# Patient Record
Sex: Female | Born: 1935 | Race: White | Hispanic: No | Smoking: Never smoker
Health system: Southern US, Community
[De-identification: ages and names within clinical notes are randomized; demographics above are authoritative.]

## PROBLEM LIST (undated history)

## (undated) DIAGNOSIS — M1990 Unspecified osteoarthritis, unspecified site: Secondary | ICD-10-CM

## (undated) DIAGNOSIS — K219 Gastro-esophageal reflux disease without esophagitis: Secondary | ICD-10-CM

## (undated) DIAGNOSIS — I2699 Other pulmonary embolism without acute cor pulmonale: Secondary | ICD-10-CM

## (undated) DIAGNOSIS — I10 Essential (primary) hypertension: Secondary | ICD-10-CM

## (undated) DIAGNOSIS — E079 Disorder of thyroid, unspecified: Secondary | ICD-10-CM

## (undated) HISTORY — PX: CHOLECYSTECTOMY: SHX55

## (undated) HISTORY — PX: ABDOMINAL HYSTERECTOMY: SHX81

---

## 2005-10-18 ENCOUNTER — Ambulatory Visit: Payer: Self-pay | Admitting: Family Medicine

## 2006-10-21 ENCOUNTER — Ambulatory Visit: Payer: Self-pay | Admitting: Family Medicine

## 2006-11-12 ENCOUNTER — Ambulatory Visit: Payer: Self-pay | Admitting: Podiatry

## 2007-01-01 ENCOUNTER — Ambulatory Visit: Payer: Self-pay | Admitting: Unknown Physician Specialty

## 2008-04-15 ENCOUNTER — Ambulatory Visit: Payer: Self-pay | Admitting: Family Medicine

## 2009-12-31 ENCOUNTER — Ambulatory Visit: Payer: Self-pay | Admitting: Cardiovascular Disease

## 2009-12-31 ENCOUNTER — Inpatient Hospital Stay: Payer: Self-pay | Admitting: Internal Medicine

## 2010-01-14 ENCOUNTER — Ambulatory Visit: Payer: Self-pay | Admitting: Oncology

## 2010-01-17 ENCOUNTER — Ambulatory Visit: Payer: Self-pay | Admitting: Oncology

## 2010-02-14 ENCOUNTER — Ambulatory Visit: Payer: Self-pay | Admitting: Oncology

## 2010-02-16 ENCOUNTER — Ambulatory Visit: Payer: Self-pay | Admitting: Internal Medicine

## 2010-04-28 ENCOUNTER — Ambulatory Visit: Payer: Self-pay | Admitting: Internal Medicine

## 2010-04-28 ENCOUNTER — Observation Stay: Payer: Self-pay | Admitting: Internal Medicine

## 2010-06-07 ENCOUNTER — Ambulatory Visit: Payer: Self-pay | Admitting: Internal Medicine

## 2010-06-08 ENCOUNTER — Ambulatory Visit: Payer: Self-pay | Admitting: Family Medicine

## 2010-07-14 ENCOUNTER — Ambulatory Visit: Payer: Self-pay | Admitting: Oncology

## 2010-07-16 ENCOUNTER — Ambulatory Visit: Payer: Self-pay | Admitting: Oncology

## 2010-09-22 ENCOUNTER — Ambulatory Visit: Payer: Self-pay | Admitting: Internal Medicine

## 2010-11-08 ENCOUNTER — Ambulatory Visit: Payer: Self-pay | Admitting: Ophthalmology

## 2011-02-28 ENCOUNTER — Ambulatory Visit: Payer: Self-pay | Admitting: Internal Medicine

## 2012-01-31 ENCOUNTER — Ambulatory Visit: Payer: Self-pay | Admitting: Nurse Practitioner

## 2012-06-09 IMAGING — CT CT CHEST W/ CM
1 series · 15 of 34 positions shown, 19 images · IV contrast (APPLIED)
Comparison: none

REASON FOR EXAM: SOB  pulmonary embolus fu
COMMENTS:

[Series 4: soft tissue · axial · 0.68mm/px · z∈[+244,+448]mm · 15 of 80 slices shown, 19 images]
[im 6/80  mediastinal]
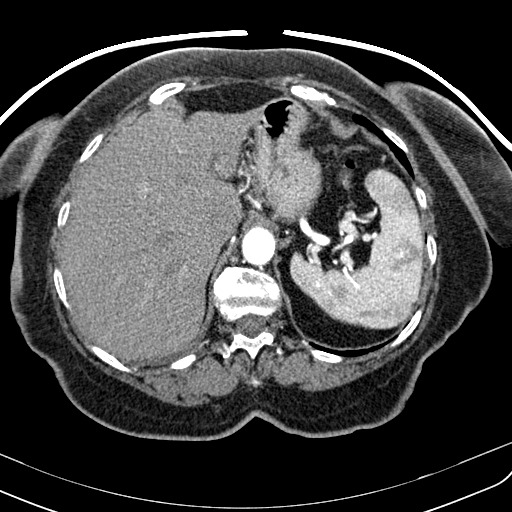
[im 6/80  lung]
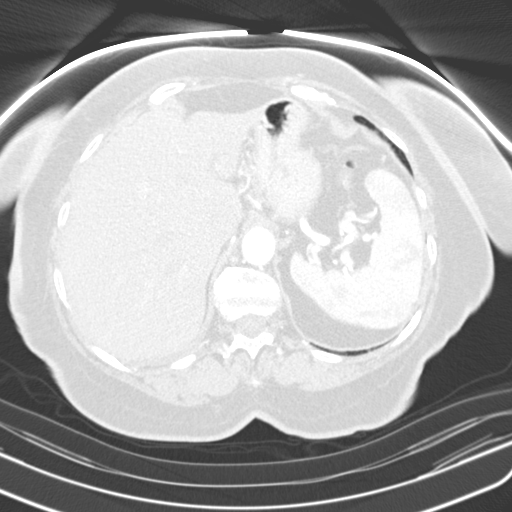
[im 12/80  lung]
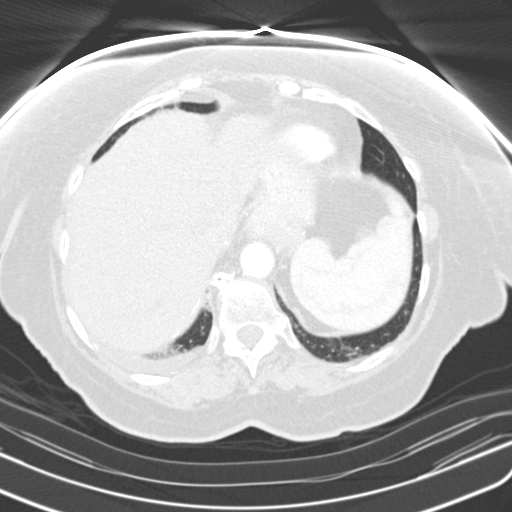
[im 16/80  lung]
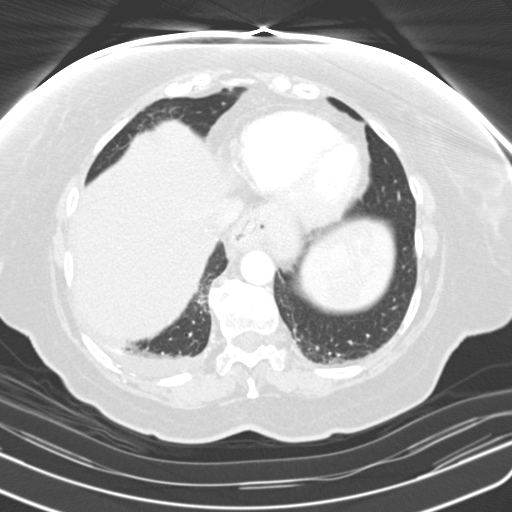
[im 21/80  lung]
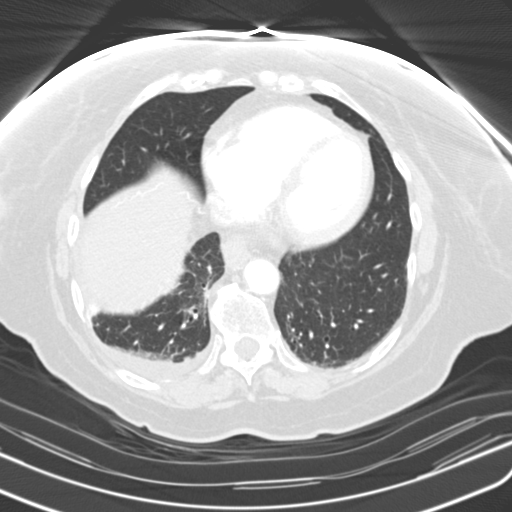
[im 27/80  mediastinal]
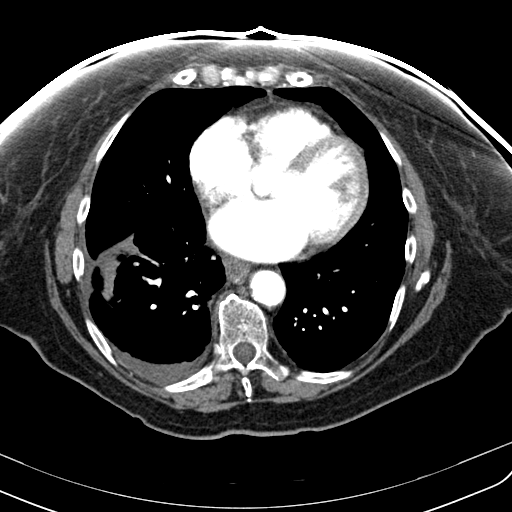
[im 27/80  lung]
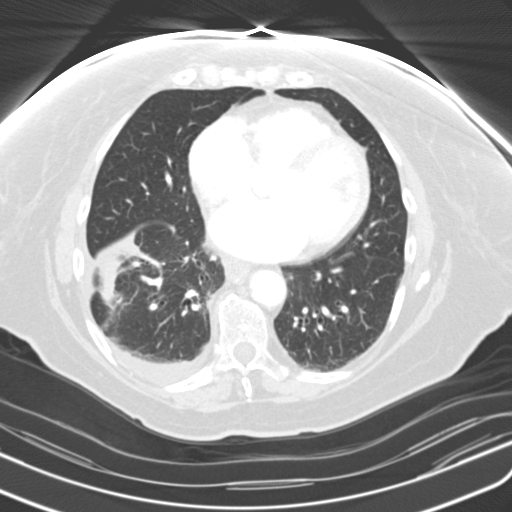
[im 32/80  lung]
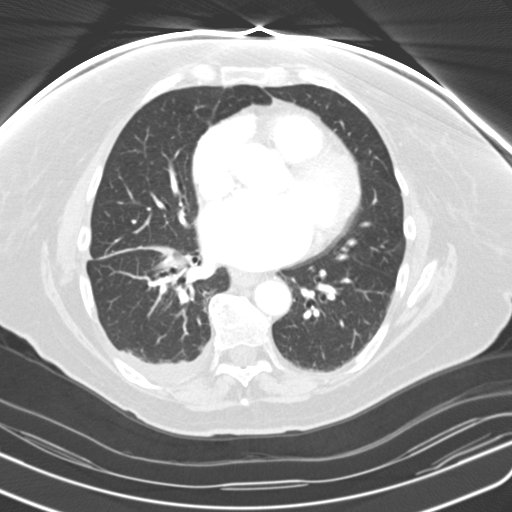
[im 36/80  lung]
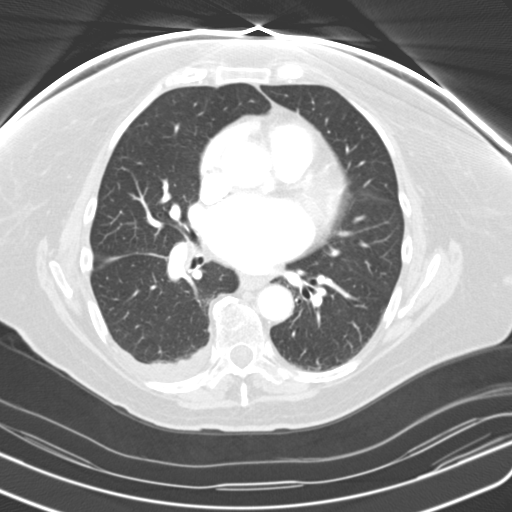
[im 41/80  lung]
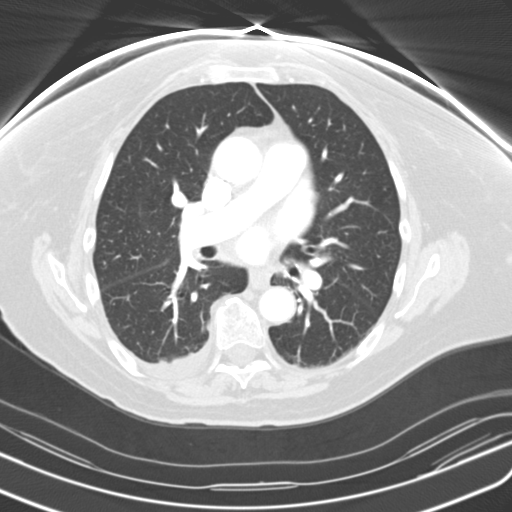
[im 44/80  mediastinal]
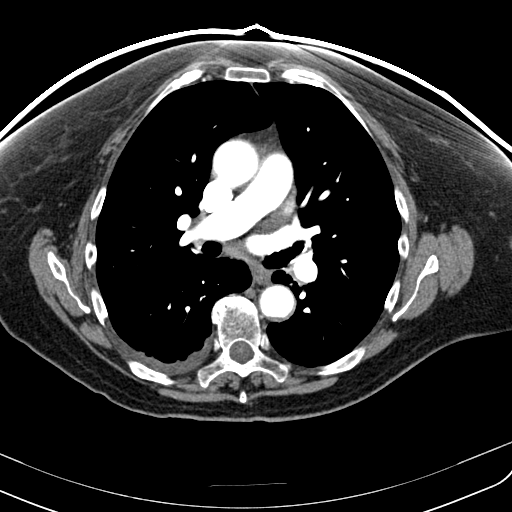
[im 44/80  lung]
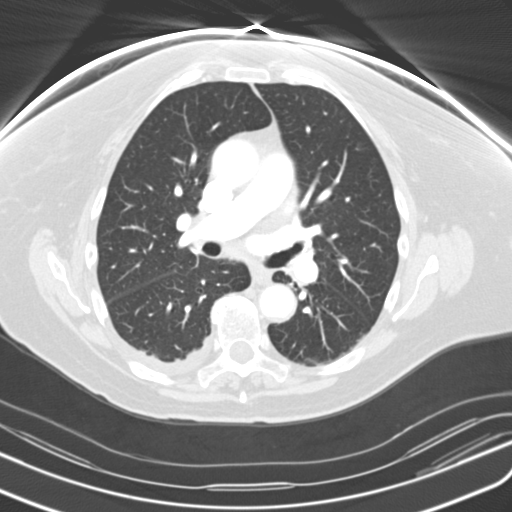
[im 48/80  lung]
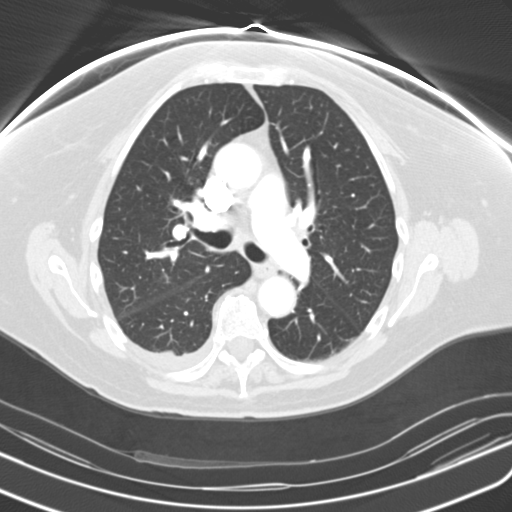
[im 53/80  lung]
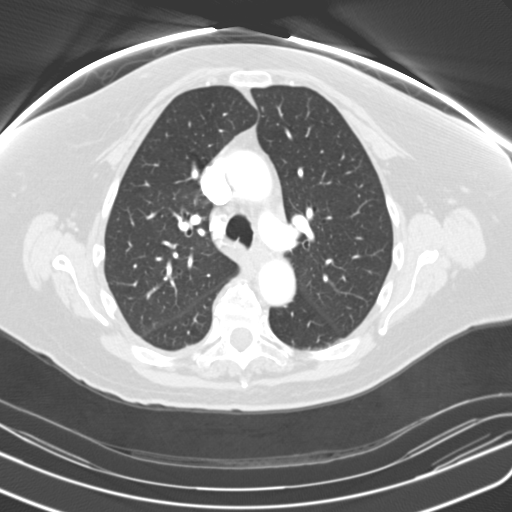
[im 59/80  lung]
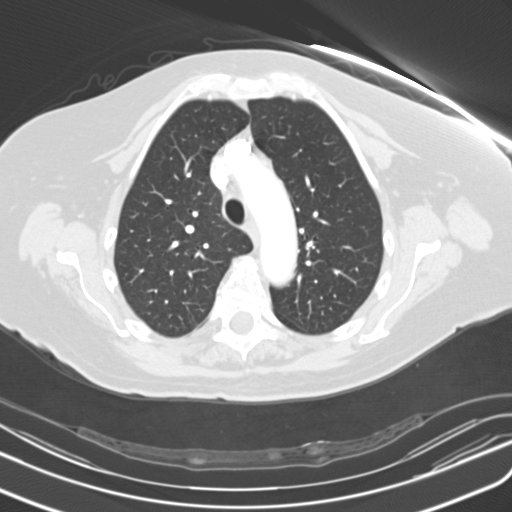
[im 64/80  mediastinal]
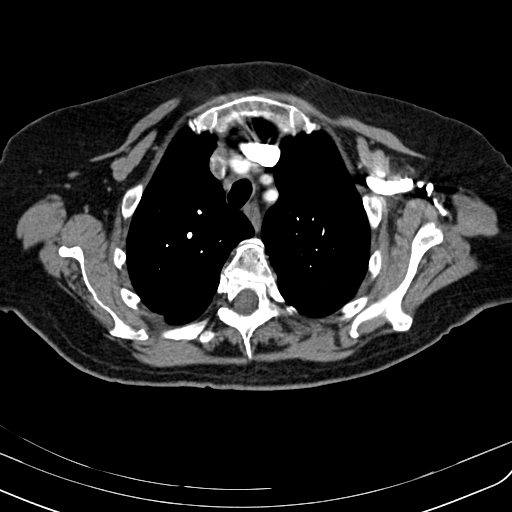
[im 64/80  lung]
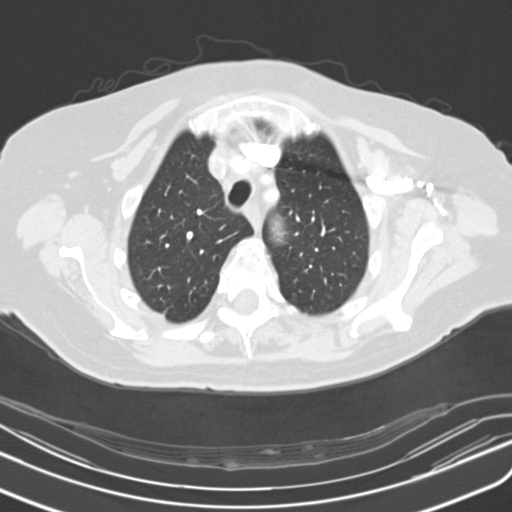
[im 68/80  lung]
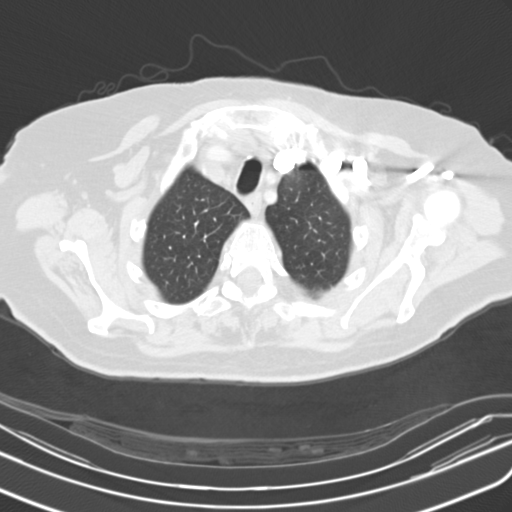
[im 74/80  lung]
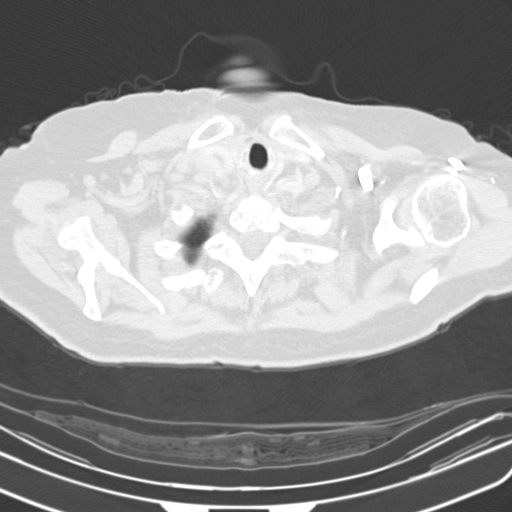

[15 of 34 positions shown; findings below may reference images not displayed]

PROCEDURE:     KRIISTINA - KRIISTINA CHEST WITH CONTRAST  - February 16, 2010 [DATE]

RESULT:     CT of the chest is performed with 100 mL of Lsovue-FRG iodinated
intravenous contrast. Images are reconstructed in the axial plane at 3 mm
slice thickness. Comparison is made to a previous CT of 12/31/2009.

The images demonstrate improved opacification of the right lower lobe
pulmonary arterial system with a lateral basal segment showing continued
thrombus and possibly evolving pulmonary infarct. There is a small right
pleural effusion. No new emboli are evident. There is improved aeration in
the right lower lobe compared to the previous exam although there do appear
to be early bronchiectatic changes in both lower lobes. Mild emphysematous
lung disease is present. The aorta is normal in caliber without dissection.
No pericardial effusion is seen. There is a moderate-sized hiatal hernia.
The upper abdominal structures included on the exam are unremarkable. The
adrenal glands are not included completely. Degenerative changes are noted
in the spine.
IMPRESSION: 1.     Some residual lateral basal segment right lower lobe pulmonary
embolism possibly with a small area of infarct. Continued right lung base
atelectasis with significantly improved aeration in the right lower lobe.
2.     Small right pleural effusion.
3.     Small to moderate sized hiatal hernia.

## 2013-03-28 ENCOUNTER — Ambulatory Visit: Payer: Self-pay | Admitting: Emergency Medicine

## 2013-03-28 ENCOUNTER — Emergency Department: Payer: Self-pay | Admitting: Psychiatry

## 2013-03-28 LAB — COMPREHENSIVE METABOLIC PANEL
Alkaline Phosphatase: 90 U/L
Anion Gap: 4 — ABNORMAL LOW (ref 7–16)
Bilirubin,Total: 0.4 mg/dL (ref 0.2–1.0)
Calcium, Total: 9.5 mg/dL (ref 8.5–10.1)
Chloride: 98 mmol/L (ref 98–107)
Co2: 33 mmol/L — ABNORMAL HIGH (ref 21–32)
EGFR (African American): >60
EGFR (Non-African Amer.): >60
Osmolality: 269 (ref 275–301)
Potassium: 3.1 mmol/L — ABNORMAL LOW (ref 3.5–5.1)
SGOT(AST): 31 U/L (ref 15–37)
Sodium: 135 mmol/L — ABNORMAL LOW (ref 136–145)

## 2013-03-28 LAB — CBC WITH DIFFERENTIAL/PLATELET
Basophil #: 0 10*3/uL (ref 0.0–0.1)
Basophil %: 0.4 %
Eosinophil #: 0.1 10*3/uL (ref 0.0–0.7)
Eosinophil %: 1.9 %
HGB: 15.3 g/dL (ref 12.0–16.0)
Lymphocyte #: 1 10*3/uL (ref 1.0–3.6)
Monocyte %: 14.8 %
Neutrophil #: 5.1 10*3/uL (ref 1.4–6.5)
Neutrophil %: 69.8 %

## 2013-03-28 LAB — URINALYSIS, COMPLETE
Bacteria: NONE SEEN
Blood: NEGATIVE
Glucose,UR: NEGATIVE mg/dL (ref 0–75)
Ketone: NEGATIVE
Nitrite: NEGATIVE
Ph: 6 (ref 4.5–8.0)
Protein: NEGATIVE
RBC,UR: <1 /HPF (ref 0–5)
Specific Gravity: 1.019 (ref 1.003–1.030)
WBC UR: <1 /HPF (ref 0–5)

## 2013-03-29 LAB — WBCS, STOOL

## 2013-04-01 LAB — STOOL CULTURE

## 2013-08-12 ENCOUNTER — Ambulatory Visit: Payer: Self-pay | Admitting: Nurse Practitioner

## 2014-08-25 ENCOUNTER — Other Ambulatory Visit: Payer: Self-pay | Admitting: Nurse Practitioner

## 2014-08-25 DIAGNOSIS — Z1239 Encounter for other screening for malignant neoplasm of breast: Secondary | ICD-10-CM

## 2014-08-31 ENCOUNTER — Ambulatory Visit
Admission: RE | Admit: 2014-08-31 | Discharge: 2014-08-31 | Disposition: A | Discharge status: Home / Self Care | Payer: Medicare Other | Source: Ambulatory Visit | Attending: Nurse Practitioner | Admitting: Nurse Practitioner

## 2014-08-31 DIAGNOSIS — Z1239 Encounter for other screening for malignant neoplasm of breast: Secondary | ICD-10-CM

## 2014-08-31 DIAGNOSIS — Z1231 Encounter for screening mammogram for malignant neoplasm of breast: Secondary | ICD-10-CM | POA: Insufficient documentation

## 2014-12-06 ENCOUNTER — Other Ambulatory Visit: Payer: Self-pay | Admitting: Family Medicine

## 2015-01-17 ENCOUNTER — Ambulatory Visit
Admission: EM | Admit: 2015-01-17 | Discharge: 2015-01-17 | Disposition: A | Discharge status: Home / Self Care | Payer: Medicare Other | Attending: Family Medicine | Admitting: Family Medicine

## 2015-01-17 ENCOUNTER — Ambulatory Visit: Payer: Medicare Other

## 2015-01-17 DIAGNOSIS — S52511A Displaced fracture of right radial styloid process, initial encounter for closed fracture: Secondary | ICD-10-CM | POA: Diagnosis not present

## 2015-01-17 HISTORY — DX: Gastro-esophageal reflux disease without esophagitis: K21.9

## 2015-01-17 HISTORY — DX: Unspecified osteoarthritis, unspecified site: M19.90

## 2015-01-17 HISTORY — DX: Disorder of thyroid, unspecified: E07.9

## 2015-01-17 HISTORY — DX: Essential (primary) hypertension: I10

## 2015-01-17 HISTORY — DX: Other pulmonary embolism without acute cor pulmonale: I26.99

## 2015-01-17 NOTE — Discharge Instructions (Signed)
Radial Fracture You have a broken bone (fracture) of the forearm. This is the part of your arm between the elbow and your wrist. Your forearm is made up of two bones. These are the radius and ulna. Your fracture is in the radial shaft. This is the bone in your forearm located on the thumb side. A cast or splint is used to protect and keep your injured bone from moving. The cast or splint will be on generally for about 5 to 6 weeks, with individual variations. HOME CARE INSTRUCTIONS   Keep the injured part elevated while sitting or lying down. Keep the injury above the level of your heart (the center of the chest). This will decrease swelling and pain.  Apply ice to the injury for 15-20 minutes, 03-04 times per day while awake, for 2 days. Put the ice in a plastic bag and place a towel between the bag of ice and your cast or splint.  Move your fingers to avoid stiffness and minimize swelling.  If you have a plaster or fiberglass cast:  Do not try to scratch the skin under the cast using sharp or pointed objects.  Check the skin around the cast every day. You may put lotion on any red or sore areas.  Keep your cast dry and clean.  If you have a plaster splint:  Wear the splint as directed.  You may loosen the elastic around the splint if your fingers become numb, tingle, or turn cold or blue.  Do not put pressure on any part of your cast or splint. It may break. Rest your cast only on a pillow for the first 24 hours until it is fully hardened.  Your cast or splint can be protected during bathing with a plastic bag. Do not lower the cast or splint into water.  Only take over-the-counter or prescription medicines for pain, discomfort, or fever as directed by your caregiver. SEEK IMMEDIATE MEDICAL CARE IF:   Your cast gets damaged or breaks.  You have more severe pain or swelling than you did before getting the cast.  You have severe pain when stretching your fingers.  There is a bad  smell, new stains and/or pus-like (purulent) drainage coming from under the cast.  Your fingers or hand turn pale or blue and become cold or your loose feeling. Document Released: 09/13/2005 Document Revised: 06/25/2011 Document Reviewed: 12/10/2005 [facility] Patient Information 2015 [place], [place]. This information is not intended to replace advice given to you by your health care provider. Make sure you discuss any questions you have with your health care provider.  Forearm Fracture The forearm is between your elbow and your wrist. It has two bones (ulna and radius). A fracture is a break in one or both of these bones. HOME CARE  Raise (elevate) your arm above the level of the heart.  Put ice on the injured area.  Put ice in a plastic bag.  Place a towel between the skin and the bag.  Leave the ice on for 15-20 minutes, 03-04 times a day.  If given a plaster or fiberglass cast:  Do not try to scratch the skin under the cast with sharp or pointed objects.  Check the skin around the cast every day. You may put lotion on any red or sore areas.  Keep the cast dry and clean.  If given a plaster splint:  Wear the splint as told.  You may loosen the elastic around the splint if the fingers become  numb, tingle, or turn cold or blue.  Do not put pressure on any part of the cast or splint. It may break. Rest the cast only on a pillow the first 24 hours until it is fully hardened.  The cast or splint can be protected during bathing with a plastic bag. Do not lower the cast or splint into water.  Only take medicine as told by your doctor. GET HELP RIGHT AWAY IF:   The cast gets damaged or breaks.  You have pain or puffiness (swelling).  The skin or nails below the injury turn blue or gray, or feel cold or numb.  There is a bad smell, new stains, or fluid coming from under the cast. MAKE SURE YOU:   Understand these instructions.  Will watch your condition.  Will get help  right away if you are not doing well or get worse. Document Released: 09/19/2007 Document Revised: 06/25/2011 Document Reviewed: 09/19/2007 [facility] Patient Information 2015 [place], [place]. This information is not intended to replace advice given to you by your health care provider. Make sure you discuss any questions you have with your health care provider.

## 2015-01-17 NOTE — ED Notes (Addendum)
States tripped yesterday and landed on flat on stomach. C/o right wrist pain with swelling and bruising. Also left knee hurts with just a small bruise. Denies LOC. Had right rib pain and right shoulder pain yesterday

## 2015-01-17 NOTE — ED Provider Notes (Signed)
CSN: [id number]     Arrival date & time 01/17/15  0909 History   First MD Initiated Contact with Patient 01/17/15 [phone]     Chief Complaint  Patient presents with  . Wrist Pain   a 81-year-old white female swelling pain in the right wrist. States she fell yesterday while at church 3 isles for her usual seat. States she went down church did not hit her head, also weight on her right wrist. Other than the right wrist she reports no significant problems and no other areas discomfort (Consider location/radiation/quality/duration/timing/severity/associated sxs/prior Treatment) Patient is a 81 y.o. female presenting with wrist pain. The history is provided by the patient and a relative. No language interpreter was used.  Wrist Pain This is a new problem. The current episode started yesterday. The problem occurs constantly. The problem has not changed since onset.Pertinent negatives include no chest pain, no abdominal pain, no headaches and no shortness of breath. The symptoms are aggravated by bending. Nothing relieves the symptoms. She has tried acetaminophen for the symptoms. The treatment provided no relief.    Past Medical History  Diagnosis Date  . GERD (gastroesophageal reflux disease)   . Arthritis   . Hypertension   . Thyroid disease   . Pulmonary embolism [facility])    Past Surgical History  Procedure Laterality Date  . Cholecystectomy    . Abdominal hysterectomy     Family History  Problem Relation Age of Onset  . Breast cancer Sister 80  . Heart attack Father    Social History  Substance Use Topics  . Smoking status: Never Smoker   . Smokeless tobacco: None  . Alcohol Use: No   OB History    No data available     Review of Systems  Constitutional: Positive for activity change.  Respiratory: Negative for shortness of breath.   Cardiovascular: Negative for chest pain.  Gastrointestinal: Negative for abdominal pain.  Neurological: Negative for dizziness and headaches.  All  other systems reviewed and are negative.  Nurse's notes were reviewed. Patient does not smoke. Had a history of blood clots about 5 years ago and was placed on blood thinner. Allergies  Relafen  Home Medications   Prior to Admission medications   Medication Sig Start Date End Date Taking? Authorizing Provider  acetaminophen (TYLENOL) 500 MG tablet Take 500 mg by mouth every 6 (six) hours as needed.   Yes Historical Provider, MD  diltiazem (TIAZAC) 180 MG 24 hr capsule Take 180 mg by mouth daily.   Yes Historical Provider, MD  hydrochlorothiazide (HYDRODIURIL) 25 MG tablet Take 25 mg by mouth daily.   Yes Historical Provider, MD  levothyroxine (SYNTHROID, LEVOTHROID) 100 MCG tablet Take 100 mcg by mouth daily before breakfast.   Yes Historical Provider, MD  Multiple Vitamins-Minerals (PRESERVISION/LUTEIN PO) Take by mouth.   Yes Historical Provider, MD  omeprazole (PRILOSEC) 20 MG capsule Take 20 mg by mouth daily.   Yes Historical Provider, MD  warfarin (COUMADIN) 2 MG tablet Take 2 mg by mouth daily.   Yes Historical Provider, MD   Meds Ordered and Administered this Visit  Medications - No data to display  BP 147/71 mmHg  Pulse 56  Temp(Src) 97.8 F (36.6 C) (Tympanic)  Resp 16  SpO2 97% No data found.   Physical Exam  Constitutional: She is oriented to person, place, and time. She appears well-developed and well-nourished.  HENT:  Head: Normocephalic and atraumatic.  Eyes: Conjunctivae are normal. Pupils are equal, round, and reactive  to light.  Musculoskeletal: She exhibits edema and tenderness.       Right wrist: She exhibits tenderness, bony tenderness and swelling.       Arms: Tenderness over the distal right radiusand tenderness or pain in the anatomical snuffbox. Is also bruise present over the same area as well. He does have good range of motion of the wrist and hand and good grip  Neurological: She is alert and oriented to person, place, and time.  Skin: Skin is warm  and dry.  Psychiatric: She has a normal mood and affect.  Vitals reviewed.   ED Course  Procedures (including critical care time)  Labs Review Labs Reviewed - No data to display  Imaging Review Dg Wrist Complete Right  01/17/2015   CLINICAL DATA:  Lateral right wrist pain following a fall yesterday.  EXAM: RIGHT WRIST - COMPLETE 3+ VIEW  COMPARISON:  None.  FINDINGS: Mild distal radial ulnar joint spur formation. Degenerative changes at the articulation of the first metacarpal and trapezium. Radiocarpal joint space narrowing and mild spur formation.  Nondisplaced fracture of the distal aspect of the radial styloid. No overlying soft tissue swelling.  IMPRESSION: 1. Nondisplaced fracture of the distal radial styloid. 2. Degenerative changes, as described above.   Electronically Signed   By: Beckie Salts M.D.   On: 01/17/2015 10:24     Visual Acuity Review  Right Eye Distance:   Left Eye Distance:   Bilateral Distance:    Right Eye Near:   Left Eye Near:    Bilateral Near:         MDM   1. Radial styloid fracture, right, closed, initial encounter     If unable to get patient seen by orthopedic today will place sugar tong splint with a sling. We'll get her into see orthopedic eventually for evaluation.  Hassan Rowan, MD 01/17/15 [phone]

## 2015-09-03 ENCOUNTER — Emergency Department
Admission: EM | Admit: 2015-09-03 | Discharge: 2015-09-03 | Disposition: A | Discharge status: Home / Self Care | Payer: Medicare Other | Attending: Emergency Medicine | Admitting: Emergency Medicine

## 2015-09-03 ENCOUNTER — Emergency Department: Payer: Medicare Other

## 2015-09-03 ENCOUNTER — Encounter: Payer: Self-pay | Admitting: Emergency Medicine

## 2015-09-03 DIAGNOSIS — M1990 Unspecified osteoarthritis, unspecified site: Secondary | ICD-10-CM | POA: Diagnosis not present

## 2015-09-03 DIAGNOSIS — Z7901 Long term (current) use of anticoagulants: Secondary | ICD-10-CM | POA: Diagnosis not present

## 2015-09-03 DIAGNOSIS — Z79899 Other long term (current) drug therapy: Secondary | ICD-10-CM | POA: Diagnosis not present

## 2015-09-03 DIAGNOSIS — E079 Disorder of thyroid, unspecified: Secondary | ICD-10-CM | POA: Insufficient documentation

## 2015-09-03 DIAGNOSIS — I10 Essential (primary) hypertension: Secondary | ICD-10-CM | POA: Insufficient documentation

## 2015-09-03 DIAGNOSIS — M25572 Pain in left ankle and joints of left foot: Secondary | ICD-10-CM | POA: Diagnosis present

## 2015-09-03 DIAGNOSIS — M10072 Idiopathic gout, left ankle and foot: Secondary | ICD-10-CM

## 2015-09-03 MED ORDER — COLCHICINE 0.6 MG PO TABS: 1.2000 mg | ORAL_TABLET | Freq: Once | ORAL | Status: DC

## 2015-09-03 MED ORDER — HYDROCODONE-ACETAMINOPHEN 5-325 MG PO TABS
1.0000 | ORAL_TABLET | ORAL | Status: DC | PRN
Start: 2015-09-03 — End: 2020-03-30

## 2015-09-03 NOTE — ED Notes (Signed)
L foot pain x 2 days, denies known injury.

## 2015-09-03 NOTE — ED Provider Notes (Signed)
[facility] Emergency Department Provider Note  ____________________________________________  Time seen: Approximately 10:09 AM  I have reviewed the triage vital signs and the nursing notes.   HISTORY  Chief Complaint Foot Pain    HPI Kayla Flowers is a 82 y.o. female who presents for evaluation of left foot and ankle pain 2 days. Patient denies any known injury and is currently on Coumadin for past history of pulmonary embolism. Patient denies any chest pains or shortness of breath states that she is unable to walk secondary to the pain.   Past Medical History  Diagnosis Date  . GERD (gastroesophageal reflux disease)   . Arthritis   . Hypertension   . Thyroid disease   . Pulmonary embolism (HCC)     There are no active problems to display for this patient.   Past Surgical History  Procedure Laterality Date  . Cholecystectomy    . Abdominal hysterectomy      Current Outpatient Rx  Name  Route  Sig  Dispense  Refill  . acetaminophen (TYLENOL) 500 MG tablet   Oral   Take 500 mg by mouth every 6 (six) hours as needed.         . colchicine 0.6 MG tablet   Oral   Take 2 tablets (1.2 mg total) by mouth once.   3 tablet   0     wait 1 hour and take 3rd pill   . diltiazem (TIAZAC) 180 MG 24 hr capsule   Oral   Take 180 mg by mouth daily.         . hydrochlorothiazide (HYDRODIURIL) 25 MG tablet   Oral   Take 25 mg by mouth daily.         Marland Kitchen HYDROcodone-acetaminophen (NORCO) 5-325 MG tablet   Oral   Take 1 tablet by mouth every 4 (four) hours as needed for moderate pain.   15 tablet   0   . levothyroxine (SYNTHROID, LEVOTHROID) 100 MCG tablet   Oral   Take 100 mcg by mouth daily before breakfast.         . Multiple Vitamins-Minerals (PRESERVISION/LUTEIN PO)   Oral   Take by mouth.         Marland Kitchen omeprazole (PRILOSEC) 20 MG capsule   Oral   Take 20 mg by mouth daily.         Marland Kitchen warfarin (COUMADIN) 2 MG tablet   Oral  Take 2 mg by mouth daily.           Allergies Relafen  Family History  Problem Relation Age of Onset  . Breast cancer Sister 26  . Heart attack Father     Social History Social History  Substance Use Topics  . Smoking status: Never Smoker   . Smokeless tobacco: None  . Alcohol Use: No    Review of Systems Constitutional: No fever/chills Musculoskeletal: Positive for left ankle and foot pain. Skin: Negative for rash. Neurological: Negative for headaches, focal weakness or numbness.  10-point ROS otherwise negative.  ____________________________________________   PHYSICAL EXAM:  VITAL SIGNS: ED Triage Vitals  Enc Vitals Group     BP 09/03/15 0939 139/70 mmHg     Pulse Rate 09/03/15 0939 64     Resp 09/03/15 0939 18     Temp 09/03/15 0939 98.3 F (36.8 C)     Temp Source 09/03/15 0939 Oral     SpO2 09/03/15 0939 96 %     Weight 09/03/15 0939 180 lb (  81.647 kg)     Height 09/03/15 0939 5\' 1"  (1.549 m)     Head Cir --      Peak Flow --      Pain Score 09/03/15 0941 8     Pain Loc --      Pain Edu? --      Excl. in GC? --     Constitutional: Alert and oriented. Well appearing and in no acute distress.  Cardiovascular: Normal rate, regular rhythm. Grossly normal heart sounds.  Good peripheral circulation. Respiratory: Normal respiratory effort.  No retractions. Lungs CTAB. Musculoskeletal: No lower extremity tenderness nor edema.  No joint effusions.Point tenderness noted to the medial aspect of the left ankle. No ecchymosis or bruising. Ankles are actually edematous area distally neurovascularly intact. Neurologic:  Normal speech and language. No gross focal neurologic deficits are appreciated. No gait instability. Skin:  Skin is warm, dry and intact. No rash noted.Positive erythema noted to both ankles with the left greater than the right. Psychiatric: Mood and affect are normal. Speech and behavior are normal.  ____________________________________________    LABS (all labs ordered are listed, but only abnormal results are displayed)  Labs Reviewed - No data to display ____________________________________________   RADIOLOGY  IMPRESSION: Negative for acute bony abnormality. Circumferential soft tissue swelling at the ankle, nonspecific. Degenerative changes. ____________________________________________   PROCEDURES  Procedure(s) performed: None  Critical Care performed: No  ____________________________________________   INITIAL IMPRESSION / ASSESSMENT AND PLAN / ED COURSE  Pertinent labs & imaging results that were available during my care of the patient were reviewed by me and considered in my medical decision making (see chart for details).  Acute left foot/ankle pain. Diagnosis is gout. Rx given for colchicine and Vicodin for pain. Patient follow-up with PCP or return to ER with worsening symptomology. No NSAIDs were given secondary to Coumadin use. ____________________________________________   FINAL CLINICAL IMPRESSION(S) / ED DIAGNOSES  Final diagnoses:  Acute idiopathic gout of left ankle     This chart was dictated using voice recognition software/Dragon. Despite best efforts to proofread, errors can occur which can change the meaning. Any change was purely unintentional.   Evangeline Dakinharles M Wake Conlee, PA-C 09/03/15 1126  Arnaldo NatalPaul F Malinda, MD 09/03/15 [phone]

## 2015-09-03 NOTE — Discharge Instructions (Signed)

## 2016-02-08 ENCOUNTER — Other Ambulatory Visit: Payer: Self-pay | Admitting: Nurse Practitioner

## 2016-02-08 DIAGNOSIS — Z1231 Encounter for screening mammogram for malignant neoplasm of breast: Secondary | ICD-10-CM

## 2016-02-16 ENCOUNTER — Encounter (INDEPENDENT_AMBULATORY_CARE_PROVIDER_SITE_OTHER): Payer: Self-pay

## 2016-02-16 ENCOUNTER — Ambulatory Visit
Admission: RE | Admit: 2016-02-16 | Discharge: 2016-02-16 | Disposition: A | Discharge status: Home / Self Care | Payer: Medicare Other | Source: Ambulatory Visit | Attending: Nurse Practitioner | Admitting: Nurse Practitioner

## 2016-02-16 DIAGNOSIS — Z1231 Encounter for screening mammogram for malignant neoplasm of breast: Secondary | ICD-10-CM | POA: Diagnosis not present

## 2017-08-08 ENCOUNTER — Other Ambulatory Visit: Payer: Self-pay | Admitting: Nurse Practitioner

## 2017-08-08 DIAGNOSIS — Z1231 Encounter for screening mammogram for malignant neoplasm of breast: Secondary | ICD-10-CM

## 2017-08-20 ENCOUNTER — Other Ambulatory Visit: Payer: Self-pay | Admitting: Nurse Practitioner

## 2017-08-20 ENCOUNTER — Ambulatory Visit
Admission: RE | Admit: 2017-08-20 | Discharge: 2017-08-20 | Disposition: A | Discharge status: Home / Self Care | Payer: Medicare Other | Source: Ambulatory Visit | Attending: Nurse Practitioner | Admitting: Nurse Practitioner

## 2017-08-20 DIAGNOSIS — Z1231 Encounter for screening mammogram for malignant neoplasm of breast: Secondary | ICD-10-CM | POA: Diagnosis present

## 2019-08-27 ENCOUNTER — Other Ambulatory Visit: Payer: Self-pay | Admitting: Nurse Practitioner

## 2019-08-27 DIAGNOSIS — Z1231 Encounter for screening mammogram for malignant neoplasm of breast: Secondary | ICD-10-CM

## 2019-09-07 ENCOUNTER — Ambulatory Visit
Admission: RE | Admit: 2019-09-07 | Discharge: 2019-09-07 | Disposition: A | Discharge status: Home / Self Care | Payer: Medicare Other | Source: Ambulatory Visit | Attending: Nurse Practitioner | Admitting: Nurse Practitioner

## 2019-09-07 ENCOUNTER — Other Ambulatory Visit: Payer: Self-pay

## 2019-09-07 DIAGNOSIS — Z1231 Encounter for screening mammogram for malignant neoplasm of breast: Secondary | ICD-10-CM | POA: Diagnosis present

## 2019-12-17 DIAGNOSIS — R1312 Dysphagia, oropharyngeal phase: Secondary | ICD-10-CM | POA: Insufficient documentation

## 2019-12-17 DIAGNOSIS — R499 Unspecified voice and resonance disorder: Secondary | ICD-10-CM | POA: Insufficient documentation

## 2019-12-18 ENCOUNTER — Other Ambulatory Visit: Payer: Self-pay | Admitting: Internal Medicine

## 2019-12-18 DIAGNOSIS — R1312 Dysphagia, oropharyngeal phase: Secondary | ICD-10-CM

## 2019-12-18 DIAGNOSIS — K219 Gastro-esophageal reflux disease without esophagitis: Secondary | ICD-10-CM

## 2019-12-23 ENCOUNTER — Other Ambulatory Visit: Payer: Self-pay

## 2019-12-23 ENCOUNTER — Ambulatory Visit
Admission: RE | Admit: 2019-12-23 | Discharge: 2019-12-23 | Disposition: A | Discharge status: Home / Self Care | Payer: Medicare Other | Source: Ambulatory Visit | Attending: Internal Medicine | Admitting: Internal Medicine

## 2019-12-23 DIAGNOSIS — R1312 Dysphagia, oropharyngeal phase: Secondary | ICD-10-CM | POA: Insufficient documentation

## 2019-12-23 DIAGNOSIS — K219 Gastro-esophageal reflux disease without esophagitis: Secondary | ICD-10-CM | POA: Insufficient documentation

## 2020-01-28 DIAGNOSIS — R058 Other specified cough: Secondary | ICD-10-CM | POA: Insufficient documentation

## 2020-01-28 DIAGNOSIS — R634 Abnormal weight loss: Secondary | ICD-10-CM | POA: Insufficient documentation

## 2020-03-29 DIAGNOSIS — D72819 Decreased white blood cell count, unspecified: Secondary | ICD-10-CM | POA: Insufficient documentation

## 2020-03-29 NOTE — Progress Notes (Signed)
[facility]  [address] [place], [place] [address] Phone: [phone]  Fax: [phone]   Clinic Day:  03/30/2020  Referring physician: Myrene Buddy, *  Chief Complaint: Kayla Flowers is a 86 y.o. female with leukopenia who is referred in consultation by Lenon Oms, NP for assessment and management.   HPI: The patient saw Lenon Oms, NP on 02/25/2020 for an annual wellness visit. She denied any complaints except for chronic mucous. She was noted to have a decreasing WBC for the past 2 years and was referred to hematology.  Labs followed: 09/19/2011: Hematocrit 45.0, hemoglobin 14.4, platelets 315,000, WBC 4,100. 03/28/2013: Hematocrit 46.1, hemoglobin 15.3, platelets 285,000, WBC 7,400.  12/20/2016: Hematocrit 43.8, hemoglobin 14.4, platelets 277,000, WBC 4,500. 02/07/2018: Hematocrit 42.3, hemoglobin 14.1, platelets 263,000, WBC 3,700 (ANC 2,200). 03/02/2019: Hematocrit 41.9, hemoglobin 13.5, platelets 257,000, WBC 2,900 (ANC 1,200). 03/15/2020: Hematocrit 44.1, hemoglobin 14.3, platelets 225,000, WBC 2,500 (ANC 1,000).  TSH was 2.34 on 03/15/2020.  The patient has a history of pulmonary embolism (2011) and DVT (2011). The patient has Factor V Leiden. She is no longer seeing hematology, but has been told that she needs to be on lifelong anticoagulation. She is currently taking Coumadin; her INR checked regularly. Last INR was 3.1 on 03/15/2020.  The patient had a PRBC transfusion in 1980.  Symptomatically, she has been "ok." She reports congestion, fatigue, occasional constipation, reflux, and arthritis "all over." Last year, she lost her appetite due to the congestion and lost 30 lbs. Her weight stabilized about 2-3 weeks ago and her appetite is starting to come back. She has occasional left sided abdominal pain. She is on Levothyroxine.  She denies fevers, sweats, headaches, changes in vision, runny nose, sore throat, cough,  shortness of breath, chest pain, palpitations, nausea, vomiting, diarrhea, urinary symptoms, skin changes, numbness, weakness, balance or coordination problems, and bleeding of any kind. She denies any concerns of infections.  The patient is currently on Coumadin. She alternates 2 mg and 1 mg. She denies any new medications or herbal products.  The patient has 4 sisters. One had breast cancer, one had thyroid cancer, one had lung cancer, and one had tongue cancer. Her daughter died last year of a rare cancer that caused tumors in her stomach. She denies a family history of blood disorders.   Past Medical History:  Diagnosis Date  . Arthritis   . GERD (gastroesophageal reflux disease)   . Hypertension   . Pulmonary embolism (HCC)   . Thyroid disease     Past Surgical History:  Procedure Laterality Date  . ABDOMINAL HYSTERECTOMY    . CHOLECYSTECTOMY      Family History  Problem Relation Age of Onset  . Heart attack Father   . Breast cancer Sister 41    Social History:  reports that she has never smoked. She does not have any smokeless tobacco history on file. She reports that she does not drink alcohol. No history on file for drug use. The patient denies any alcohol or tobacco use. She denies any exposure to radiation or toxins. She worked at [employer]. She retired at age 45. She lives alone. She lives in [place]. The patient is alone today.  Allergies:  Allergies  Allergen Reactions  . Relafen [Nabumetone] Itching    Current Medications: Current Outpatient Medications  Medication Sig Dispense Refill  . acetaminophen (TYLENOL) 500 MG tablet Take 500 mg by mouth every 6 (six) hours as needed.    Marland Kitchen  diltiazem (CARDIZEM CD) 180 MG 24 hr capsule     . hydrochlorothiazide (HYDRODIURIL) 25 MG tablet Take 25 mg by mouth daily.    Marland Kitchen levothyroxine (SYNTHROID, LEVOTHROID) 100 MCG tablet Take 100 mcg by mouth daily before breakfast.    . Multiple Vitamin (MULTI-VITAMIN) tablet Take 1 tablet  by mouth daily.    Marland Kitchen omeprazole (PRILOSEC) 20 MG capsule Take 20 mg by mouth daily.    Marland Kitchen warfarin (COUMADIN) 2 MG tablet Take 2 mg by mouth daily.     No current facility-administered medications for this visit.    Review of Systems  Constitutional: Positive for malaise/fatigue and weight loss (30 lbs in the past year). Negative for chills, diaphoresis and fever.  HENT: Positive for congestion. Negative for ear discharge, ear pain, hearing loss, nosebleeds, sinus pain, sore throat and tinnitus.   Eyes: Negative for blurred vision.  Respiratory: Negative for cough, hemoptysis, sputum production and shortness of breath.   Cardiovascular: Negative for chest pain, palpitations and leg swelling.  Gastrointestinal: Positive for abdominal pain (left side, occasional) and heartburn (on meds). Negative for blood in stool, constipation (occasional), diarrhea, melena, nausea and vomiting.       Poor appetite  Genitourinary: Negative for dysuria, frequency, hematuria and urgency.  Musculoskeletal: Positive for joint pain (knees, shoulder; arthritis "all over"). Negative for back pain, myalgias and neck pain.  Skin: Negative for itching and rash.  Neurological: Negative for dizziness, tingling, sensory change, weakness and headaches.  Endo/Heme/Allergies: Does not bruise/bleed easily.       On levothyroxine  Psychiatric/Behavioral: Negative for depression and memory loss. The patient is not nervous/anxious and does not have insomnia.   All other systems reviewed and are negative.  Performance status (ECOG): 1-2  Vitals Blood pressure 122/72, pulse 81, temperature (!) 96.3 F (35.7 C), temperature source Tympanic, resp. rate 18, weight 129 lb 8.3 oz (58.7 kg), SpO2 97 %.   Physical Exam Vitals and nursing note reviewed.  Constitutional:      General: She is not in acute distress.    Appearance: She is not diaphoretic.     Comments: Cane by her side. Requires assistance onto exam table.  HENT:      Head: Normocephalic and atraumatic.     Mouth/Throat:     Mouth: Mucous membranes are moist.     Pharynx: Oropharynx is clear.  Eyes:     General: No scleral icterus.    Extraocular Movements: Extraocular movements intact.     Conjunctiva/sclera: Conjunctivae normal.     Pupils: Pupils are equal, round, and reactive to light.  Cardiovascular:     Rate and Rhythm: Normal rate and regular rhythm.     Heart sounds: Normal heart sounds. No murmur heard.   Pulmonary:     Effort: Pulmonary effort is normal. No respiratory distress.     Breath sounds: Normal breath sounds. No wheezing or rales.  Chest:     Chest wall: No tenderness.  Breasts:     Right: No axillary adenopathy or supraclavicular adenopathy.     Left: No axillary adenopathy or supraclavicular adenopathy.    Abdominal:     General: Bowel sounds are normal. There is no distension.     Palpations: Abdomen is soft. There is no hepatomegaly, splenomegaly or mass.     Tenderness: There is no abdominal tenderness. There is no guarding or rebound.  Musculoskeletal:        General: No swelling or tenderness. Normal range of motion.  Cervical back: Normal range of motion and neck supple.     Right lower leg: Edema (chronic) present.     Left lower leg: Edema (chronic) present.  Lymphadenopathy:     Head:     Right side of head: No preauricular, posterior auricular or occipital adenopathy.     Left side of head: No preauricular, posterior auricular or occipital adenopathy.     Cervical: No cervical adenopathy.     Upper Body:     Right upper body: No supraclavicular or axillary adenopathy.     Left upper body: No supraclavicular or axillary adenopathy.     Lower Body: No right inguinal adenopathy. No left inguinal adenopathy.  Skin:    General: Skin is warm and dry.  Neurological:     Mental Status: She is alert and oriented to person, place, and time.  Psychiatric:        Behavior: Behavior normal.        Thought  Content: Thought content normal.        Judgment: Judgment normal.    No visits with results within 3 Day(s) from this visit.  Latest known visit with results is:  No results found for any previous visit.    Assessment:  Kayla Flowers is a 86 y.o. female with leukopenia x 2 years.  WBC has been trending down from 3700 - 2500 (ANC 2200 - 1000).  Diet is improving.  She denies any new medications or herbal products  Labs on 03/15/2020 included a hematocrit of 44.1, hemoglobin 14.3, platelets 225,000, WBC 2,500 (ANC 1,000).  She has a history of pulmonary embolism (2011-2012) and DVT (2011). Work-up revealed Factor V Leiden. She is on lifelong anticoagulation. She is on Coumadin (2 mg alternating with 1 mg).  She received the Moderna COVID-19 vaccine on 09/07/2019 and 10/05/2019.  Symptomatically, she feels "ok." She reports congestion, fatigue, occasional constipation, reflux, and arthritis "all over." Last year, she lost 30 lbs. Her weight stabilized about 2-3 weeks ago; her appetite is starting to come back. She has occasional left sided abdominal pain.  She denies any infections.  Plan: 1.   Labs today:  CBC with diff, CMP, hepatitis B core antibody total, hepatitis C antibody, HIV, ANA with reflex, B12, folate, copper. 2.   Peripheral smear for path review. 3.   Leukopenia  Differential diagnosis:  medications, infections (hepatitis, HIV), autoimmune, nutritional (B12, folate, copper), hematologic disorders.   Discuss work-up. 4.   RTC in 1 week for MD assessment, review of work-up and discussion regarding direction of therapy.  I discussed the assessment and treatment plan with the patient.  The patient was provided an opportunity to ask questions and all were answered.  The patient agreed with the plan and demonstrated an understanding of the instructions.  The patient was advised to call back if the symptoms worsen or if the condition fails to improve as anticipated.  I provided 22  minutes of face-to-face time during this this encounter and > 50% was spent counseling as documented under my assessment and plan.  An additional 10-15 minutes were spent reviewing her chart (Epic and Care Everywhere) including notes, labs, and imaging studies.    Brinda Focht C. Merlene Pulling, MD, PhD    03/30/2020, 2:43 PM  I, Danella Penton Tufford, am acting as Neurosurgeon for [employer]. Merlene Pulling, MD, PhD.  I, Ronell Boldin C. Merlene Pulling, MD, have reviewed the above documentation for accuracy and completeness, and I agree with the above.

## 2020-03-30 ENCOUNTER — Inpatient Hospital Stay: Payer: Medicare Other | Attending: Hematology and Oncology | Admitting: Hematology and Oncology

## 2020-03-30 ENCOUNTER — Ambulatory Visit: Payer: Medicare Other

## 2020-03-30 ENCOUNTER — Other Ambulatory Visit: Payer: Self-pay

## 2020-03-30 ENCOUNTER — Encounter: Payer: Self-pay | Admitting: Hematology and Oncology

## 2020-03-30 VITALS — BP 122/72 | HR 81 | Temp 96.3°F | Resp 18 | Wt 129.5 lb

## 2020-03-30 DIAGNOSIS — E785 Hyperlipidemia, unspecified: Secondary | ICD-10-CM | POA: Insufficient documentation

## 2020-03-30 DIAGNOSIS — E039 Hypothyroidism, unspecified: Secondary | ICD-10-CM | POA: Insufficient documentation

## 2020-03-30 DIAGNOSIS — K219 Gastro-esophageal reflux disease without esophagitis: Secondary | ICD-10-CM | POA: Insufficient documentation

## 2020-03-30 DIAGNOSIS — D72819 Decreased white blood cell count, unspecified: Secondary | ICD-10-CM | POA: Diagnosis present

## 2020-03-30 DIAGNOSIS — D6851 Activated protein C resistance: Secondary | ICD-10-CM | POA: Insufficient documentation

## 2020-03-30 DIAGNOSIS — M1990 Unspecified osteoarthritis, unspecified site: Secondary | ICD-10-CM | POA: Insufficient documentation

## 2020-03-30 DIAGNOSIS — I10 Essential (primary) hypertension: Secondary | ICD-10-CM | POA: Insufficient documentation

## 2020-03-30 DIAGNOSIS — M8580 Other specified disorders of bone density and structure, unspecified site: Secondary | ICD-10-CM | POA: Insufficient documentation

## 2020-03-30 LAB — COMPREHENSIVE METABOLIC PANEL
ALT: 15 U/L (ref 0–44)
AST: 25 U/L (ref 15–41)
Albumin: 3.9 g/dL (ref 3.5–5.0)
Alkaline Phosphatase: 66 U/L (ref 38–126)
Anion gap: 12 (ref 5–15)
BUN: 19 mg/dL (ref 8–23)
CO2: 31 mmol/L (ref 22–32)
Calcium: 9.4 mg/dL (ref 8.9–10.3)
Chloride: 94 mmol/L — ABNORMAL LOW (ref 98–111)
Creatinine, Ser: 0.84 mg/dL (ref 0.44–1.00)
GFR, Estimated: >60 mL/min (ref >60)
Glucose, Bld: 85 mg/dL (ref 70–99)
Potassium: 3.4 mmol/L — ABNORMAL LOW (ref 3.5–5.1)
Sodium: 137 mmol/L (ref 135–145)
Total Bilirubin: 0.7 mg/dL (ref 0.3–1.2)
Total Protein: 7.4 g/dL (ref 6.5–8.1)

## 2020-03-30 LAB — CBC WITH DIFFERENTIAL/PLATELET
Abs Immature Granulocytes: 0.01 10*3/uL (ref 0.00–0.07)
Basophils Absolute: 0.1 10*3/uL (ref 0.0–0.1)
Basophils Relative: 1 %
Eosinophils Absolute: 0.1 10*3/uL (ref 0.0–0.5)
Eosinophils Relative: 2 %
HCT: 44.1 % (ref 36.0–46.0)
Hemoglobin: 14.2 g/dL (ref 12.0–15.0)
Immature Granulocytes: 0 %
Lymphocytes Relative: 25 %
Lymphs Abs: 1.2 10*3/uL (ref 0.7–4.0)
MCH: 29.2 pg (ref 26.0–34.0)
MCHC: 32.2 g/dL (ref 30.0–36.0)
MCV: 90.7 fL (ref 80.0–100.0)
Monocytes Absolute: 0.6 10*3/uL (ref 0.1–1.0)
Monocytes Relative: 12 %
Neutro Abs: 2.9 10*3/uL (ref 1.7–7.7)
Neutrophils Relative %: 60 %
Platelets: 308 10*3/uL (ref 150–400)
RBC: 4.86 MIL/uL (ref 3.87–5.11)
RDW: 14.4 % (ref 11.5–15.5)
WBC: 4.9 10*3/uL (ref 4.0–10.5)
nRBC: 0 % (ref 0.0–0.2)

## 2020-03-30 LAB — FOLATE: Folate: 30 ng/mL (ref >5.9)

## 2020-03-30 LAB — HEPATITIS B CORE ANTIBODY, TOTAL: Hep B Core Total Ab: NONREACTIVE

## 2020-03-30 LAB — VITAMIN B12: Vitamin B-12: 237 pg/mL (ref 180–914)

## 2020-03-30 LAB — HIV ANTIBODY (ROUTINE TESTING W REFLEX): HIV Screen 4th Generation wRfx: NONREACTIVE

## 2020-03-30 LAB — HEPATITIS C ANTIBODY: HCV Ab: NONREACTIVE

## 2020-03-30 NOTE — Progress Notes (Signed)
Patient reports nocturia and knee pain especially when laying in the bed.

## 2020-03-31 LAB — ENA+DNA/DS+SJORGEN'S
ENA SM Ab Ser-aCnc: <0.2 AI (ref 0.0–0.9)
Ribonucleic Protein: <0.2 AI (ref 0.0–0.9)
SSA (Ro) (ENA) Antibody, IgG: <0.2 AI (ref 0.0–0.9)
SSB (La) (ENA) Antibody, IgG: <0.2 AI (ref 0.0–0.9)
ds DNA Ab: 6 IU/mL (ref 0–9)

## 2020-03-31 LAB — PATHOLOGIST SMEAR REVIEW

## 2020-03-31 LAB — ANA W/REFLEX: Anti Nuclear Antibody (ANA): POSITIVE — AB

## 2020-04-01 LAB — COPPER, SERUM: Copper: 111 ug/dL (ref 80–158)

## 2020-04-05 ENCOUNTER — Telehealth: Payer: Self-pay

## 2020-04-05 NOTE — Progress Notes (Signed)
C-

## 2020-04-05 NOTE — Progress Notes (Signed)
[facility]  [address] [place], [place] [address] Phone: [phone]  Fax: [phone]   Clinic Day:  04/07/2020  Referring physician: Myrene Flowers, *  Chief Complaint: Kayla Flowers is a 86 y.o. female with leukopenia who is seen for review of work-up and discussion regarding direction of therapy.  HPI: The patient was last seen in the hematology clinic on 03/30/2020 for new patient assessment. She had a history of leukopenia x 2 years.  WBC was trending down from 3700 - 2500 (ANC 2200-1000).  Symptomatically, she felt "ok".  She described fatigue and arthritis.  She had lost a significant amount of weight due to congestion last May.  Weight had stabilized and appetite was returning about 2 to 3 weeks prior.  Work-up revealed a hematocrit of 44.1, hemoglobin 14.2, platelets 308,000, WBC 4,900 (ANC 2900).  ANA was positive (negative ENA+DNA/DS+Sjorgen's test). Copper was 111. Vitamin B12 was 237 (low) and folate 30.0. HIV antibody, hepatitis B core antibody and hepatitis C antibody were non reactive.   Peripheral smear revealed normal leukocyte count and differential. There were few neutrophils with reduced nuclear lobation, otherwise unremarkable morphology. No circulating blasts were identified. There were normocytic erythrocytes, with unremarkable RBC morphology and parameters. Platelet count and morphology were normal. There were no significant abnormalities by morphologic review.  During the interim, she has been "fine". She denies any new symptoms or complaints. Her arthritis is stable and is bothering her the most in her hip. She is eating better because of the holidays.  She received a call about starting vitamin B12 1000 mcg daily but has not started it yet.   Past Medical History:  Diagnosis Date  . Arthritis   . GERD (gastroesophageal reflux disease)   . Hypertension   . Pulmonary embolism (HCC)   . Thyroid disease     Past  Surgical History:  Procedure Laterality Date  . ABDOMINAL HYSTERECTOMY    . CHOLECYSTECTOMY      Family History  Problem Relation Age of Onset  . Heart attack Father   . Breast cancer Sister 63    Social History:  reports that she has never smoked. She does not have any smokeless tobacco history on file. She reports that she does not drink alcohol. No history on file for drug use. The patient denies any alcohol or tobacco use. She denies any exposure to radiation or toxins. She worked at [employer]. She retired at age 46. She lives alone. She lives in [place]. The patient is accompanied by her daughter, Kayla Flowers, today.  Allergies:  Allergies  Allergen Reactions  . Relafen [Nabumetone] Itching    Current Medications: Current Outpatient Medications  Medication Sig Dispense Refill  . acetaminophen (TYLENOL) 500 MG tablet Take 500 mg by mouth every 6 (six) hours as needed.    . diltiazem (CARDIZEM CD) 180 MG 24 hr capsule     . hydrochlorothiazide (HYDRODIURIL) 25 MG tablet Take 25 mg by mouth daily.    Marland Kitchen levothyroxine (SYNTHROID, LEVOTHROID) 100 MCG tablet Take 100 mcg by mouth daily before breakfast.    . Multiple Vitamin (MULTI-VITAMIN) tablet Take 1 tablet by mouth daily.    Marland Kitchen omeprazole (PRILOSEC) 20 MG capsule Take 20 mg by mouth daily.    Marland Kitchen warfarin (COUMADIN) 2 MG tablet Take 2 mg by mouth daily.     No current facility-administered medications for this visit.    Review of Systems  Constitutional: Positive for malaise/fatigue. Negative for chills, diaphoresis,  fever and weight loss (up 1 lb).  HENT: Positive for congestion. Negative for ear discharge, ear pain, hearing loss, nosebleeds, sinus pain, sore throat and tinnitus.   Eyes: Negative for blurred vision.  Respiratory: Negative for cough, hemoptysis, sputum production and shortness of breath.   Cardiovascular: Negative for chest pain, palpitations and leg swelling.  Gastrointestinal: Positive for abdominal pain (left side,  occasional), constipation (occasional) and heartburn (on medication). Negative for blood in stool, diarrhea, melena, nausea and vomiting.       Eating better due to the holidays  Genitourinary: Negative for dysuria, frequency, hematuria and urgency.  Musculoskeletal: Positive for joint pain (arthritis "all over," hips). Negative for back pain, myalgias and neck pain.  Skin: Negative for itching and rash.  Neurological: Negative for dizziness, tingling, sensory change, weakness and headaches.  Endo/Heme/Allergies: Does not bruise/bleed easily.       On levothyroxine  Psychiatric/Behavioral: Negative for depression and memory loss. The patient is not nervous/anxious and does not have insomnia.   All other systems reviewed and are negative.  Performance status (ECOG): 1  Vitals Blood pressure 123/60, pulse 90, temperature 97.7 F (36.5 C), temperature source Tympanic, resp. rate 18, weight 130 lb 15.3 oz (59.4 kg), SpO2 100 %.   Physical Exam Vitals and nursing note reviewed.  Constitutional:      General: She is not in acute distress.    Appearance: She is not diaphoretic.  HENT:     Head: Normocephalic and atraumatic.     Comments: Short gray hair. Eyes:     General: No scleral icterus.    Conjunctiva/sclera: Conjunctivae normal.     Comments: Glasses.  Neurological:     Mental Status: She is alert and oriented to person, place, and time.  Psychiatric:        Behavior: Behavior normal.        Thought Content: Thought content normal.        Judgment: Judgment normal.    No visits with results within 3 Day(s) from this visit.  Latest known visit with results is:  Clinical Support on 03/30/2020  Component Date Value Ref Range Status  . Anti Nuclear Antibody (ANA) 03/30/2020 Positive* Negative Final   Comment: (NOTE) Performed At: [facility] [address] [place], [place] [id number] Jolene Schimke MD WU:[id number]   . Path Review 03/30/2020 Blood smear is  reviewed.   Final   Comment: Normal leukocyte count and differential. Few neutrophils with reduced nuclear lobation. Otherwise unremarkable morphology. No circulating blasts identified. Normocytic erythrocytes, with unremarkable RBC morphology and parameters. Normal platelet count and morphology. No significant abnormalities by morphologic review. Clinical correlation is recommended. Reviewed by Beryle Quant, M.D. Performed at [facility], [address] [place], [place] [address]   . HCV Ab 03/30/2020 NON REACTIVE  NON REACTIVE Final   Comment: (NOTE) Nonreactive HCV antibody screen is consistent with no HCV infections,  unless recent infection is suspected or other evidence exists to indicate HCV infection.  Performed at [facility], [address], [place], [place] [address]   . Hep B Core Total Ab 03/30/2020 NON REACTIVE  NON REACTIVE Final   Performed at [facility], [address] [place], [place] [address]  . HIV Screen 4th Generation wRfx 03/30/2020 Non Reactive  Non Reactive Final   Performed at [facility], [address], [place], [place] [address]  . Copper 03/30/2020 111  80 - 158 ug/dL Final   Comment: (  NOTE) This test was developed and its performance characteristics determined by [facility]. It has not been cleared or approved by the Food and Drug Administration.                                Detection Limit = 5 Performed At: [facility] [address] [place], [place] [id number] Jolene SchimkeNagendra Sanjai MD WG:[id number]:[phone]   . Folate 03/30/2020 30.0  >5.9 ng/mL Final   Performed at [facility], [address] [place], [place] [address]  . Vitamin B-12 03/30/2020 237  180 - 914 pg/mL Final   Comment: (NOTE) This assay is not validated for testing neonatal or myeloproliferative syndrome specimens for Vitamin B12 levels. Performed at [facility], [address] [place], [place] [address]   . Sodium 03/30/2020  137  135 - 145 mmol/L Final  . Potassium 03/30/2020 3.4* 3.5 - 5.1 mmol/L Final  . Chloride 03/30/2020 94* 98 - 111 mmol/L Final  . CO2 03/30/2020 31  22 - 32 mmol/L Final  . Glucose, Bld 03/30/2020 85  70 - 99 mg/dL Final   Glucose reference range applies only to samples taken after fasting for at least 8 hours.  . BUN 03/30/2020 19  8 - 23 mg/dL Final  . Creatinine, Ser 03/30/2020 0.84  0.44 - 1.00 mg/dL Final  . Calcium 84/13/244012/15/2021 9.4  8.9 - 10.3 mg/dL Final  . Total Protein 03/30/2020 7.4  6.5 - 8.1 g/dL Final  . Albumin 10/27/253612/15/2021 3.9  3.5 - 5.0 g/dL Final  . AST 64/40/347412/15/2021 25  15 - 41 U/L Final  . ALT 03/30/2020 15  0 - 44 U/L Final  . Alkaline Phosphatase 03/30/2020 66  38 - 126 U/L Final  . Total Bilirubin 03/30/2020 0.7  0.3 - 1.2 mg/dL Final  . GFR, Estimated 03/30/2020 >60  >60 mL/min Final   Comment: (NOTE) Calculated using the CKD-EPI Creatinine Equation (2021)   . Anion gap 03/30/2020 12  5 - 15 Final   Performed at [facility], [address] [place], [place] [address]  . WBC 03/30/2020 4.9  4.0 - 10.5 K/uL Final  . RBC 03/30/2020 4.86  3.87 - 5.11 MIL/uL Final  . Hemoglobin 03/30/2020 14.2  12.0 - 15.0 g/dL Final  . HCT 38/75/643312/15/2021 44.1  36.0 - 46.0 % Final  . MCV 03/30/2020 90.7  80.0 - 100.0 fL Final  . MCH 03/30/2020 29.2  26.0 - 34.0 pg Final  . MCHC 03/30/2020 32.2  30.0 - 36.0 g/dL Final  . RDW 29/51/884112/15/2021 14.4  11.5 - 15.5 % Final  . Platelets 03/30/2020 308  150 - 400 K/uL Final  . nRBC 03/30/2020 0.0  0.0 - 0.2 % Final  . Neutrophils Relative % 03/30/2020 60  % Final  . Neutro Abs 03/30/2020 2.9  1.7 - 7.7 K/uL Final  . Lymphocytes Relative 03/30/2020 25  % Final  . Lymphs Abs 03/30/2020 1.2  0.7 - 4.0 K/uL Final  . Monocytes Relative 03/30/2020 12  % Final  . Monocytes Absolute 03/30/2020 0.6  0.1 - 1.0 K/uL Final  . Eosinophils Relative 03/30/2020 2  % Final  . Eosinophils Absolute 03/30/2020 0.1  0.0 - 0.5 K/uL Final  . Basophils  Relative 03/30/2020 1  % Final  . Basophils Absolute 03/30/2020 0.1  0.0 - 0.1 K/uL Final  . Immature Granulocytes 03/30/2020 0  % Final  . Abs Immature Granulocytes 03/30/2020  0.01  0.00 - 0.07 K/uL Final   Performed at [facility], [address] [place], [place] [address]  . ds DNA Ab 03/30/2020 6  0 - 9 IU/mL Final   Comment: (NOTE)                                   Negative      <5                                   Equivocal  5 - 9                                   Positive      >9   . Ribonucleic Protein 03/30/2020 <0.2  0.0 - 0.9 AI Final  . ENA SM Ab Ser-aCnc 03/30/2020 <0.2  0.0 - 0.9 AI Final  . SSA (Ro) (ENA) Antibody, IgG 03/30/2020 <0.2  0.0 - 0.9 AI Final  . SSB (La) (ENA) Antibody, IgG 03/30/2020 <0.2  0.0 - 0.9 AI Final  . See below: 03/30/2020 Comment   Final   Comment: (NOTE) Autoantibody                       Disease Association ------------------------------------------------------------                        Condition                  Frequency ---------------------   ------------------------   --------- Antinuclear Antibody,    SLE, mixed connective Direct (ANA-D)           tissue diseases ---------------------   ------------------------   --------- dsDNA                    SLE                        40 - 60% ---------------------   ------------------------   --------- Chromatin                Drug induced SLE                90%                         SLE                        48 - 97% ---------------------   ------------------------   --------- SSA (Ro)                 SLE                        25 - 35%                         Sjogren's Syndrome         40 - 70%                         Neonatal Lupus  100% ---------------------   ------------------------   --------- SSB (La)                 SLE                                                       10%                         Sjogren's Syndrome               30% ---------------------   -----------------------    --------- Sm (anti-Smith)          SLE                        15 - 30% ---------------------   -----------------------    --------- RNP                      Mixed Connective Tissue                         Disease                         95% (U1 nRNP,                SLE                        30 - 50% anti-ribonucleoprotein)  Polymyositis and/or                         Dermatomyositis                 20% ---------------------   ------------------------   --------- Scl-70 (antiDNA          Scleroderma (diffuse)      20 - 35% topoisomerase)           Crest                           13% ---------------------   ------------------------   --------- Jo-1                     Polymyositis and/or                         Dermatomyositis            20 - 40% ---------------------   ------------------------   --------- Centromere B             Scleroderma -                           Crest                         variant                         80% Performed At: [facility] [address] [place], [place] [id number] Jolene Schimke MD PT:[id number]     Assessment:  Kayla Flowers  Kayla Flowers is a 86 y.o. female with leukopenia x 2 years.  WBC has been trending down from 3700 - 2500 (ANC 2200 - 1000).  Diet is improving.  She denies any new medications or herbal products  Labs on 03/15/2020 included a hematocrit of 44.1, hemoglobin 14.3, platelets 225,000, WBC 2,500 (ANC 1,000).  Work-up on 03/30/2020 revealed a hematocrit of 44.1, hemoglobin 14.2, platelets 308,000, WBC 4,900 (ANC 2900).  ANA was positive (negative ENA+DNA/DS+Sjorgen's test). Copper was 111. Vitamin B12 was 237 (low) and folate 30.0. HIV antibody, hepatitis B core antibody and hepatitis C antibody were non reactive.   She has a history of pulmonary embolism (2011-2012) and DVT (2011). Work-up revealed Factor V Leiden. She is on lifelong anticoagulation. She is on Coumadin.  She  received the Moderna COVID-19 vaccine on 09/07/2019 and 10/05/2019.  Symptomatically, she feels "fine". Her arthritis is stable and is bothering her the most in her hip. She is eating better because of the holidays.  Plan: 1.   Review work-up. 2.   Leukopenia, resolved  WBC 4900 with an ANC of 2900 on 03/30/2020.   Work-up revealed no abnormalities except for a mild B12 deficiency.  Diet has improved.  Reassurance provided. 3.   B12 deficiency  B12 was 237 on 03/30/2020.  Encourage patient to begin B12 1000 mcg a day.  B12 goal is 400. 4.   Patient to begin oral B12. 5.   Anticipate f/u B12 level in 1 month with Lenon Oms, NP. 6.   RTC prn.  I discussed the assessment and treatment plan with the patient.  The patient was provided an opportunity to ask questions and all were answered.  The patient agreed with the plan and demonstrated an understanding of the instructions.  The patient was advised to call back if the symptoms worsen or if the condition fails to improve as anticipated.   Mena Lienau C. Merlene Pulling, MD, PhD    04/07/2020, 10:11 AM  I, Danella Penton Tufford, am acting as Neurosurgeon for [employer]. Merlene Pulling, MD, PhD.  I, Aireona Torelli C. Merlene Pulling, MD, have reviewed the above documentation for accuracy and completeness, and I agree with the above.

## 2020-04-07 ENCOUNTER — Inpatient Hospital Stay (HOSPITAL_BASED_OUTPATIENT_CLINIC_OR_DEPARTMENT_OTHER): Payer: Medicare Other | Admitting: Hematology and Oncology

## 2020-04-07 ENCOUNTER — Encounter: Payer: Self-pay | Admitting: Hematology and Oncology

## 2020-04-07 ENCOUNTER — Other Ambulatory Visit: Payer: Self-pay

## 2020-04-07 VITALS — BP 123/60 | HR 90 | Temp 97.7°F | Resp 18 | Wt 131.0 lb

## 2020-04-07 DIAGNOSIS — E538 Deficiency of other specified B group vitamins: Secondary | ICD-10-CM | POA: Insufficient documentation

## 2020-04-07 DIAGNOSIS — D72819 Decreased white blood cell count, unspecified: Secondary | ICD-10-CM

## 2020-04-07 NOTE — Progress Notes (Signed)
No new changes noted today 

## 2023-02-19 NOTE — Telephone Encounter (Signed)
Old telephone note, Signing encounter to close chart

## 2023-08-15 DEATH — deceased
